# Patient Record
Sex: Male | Born: 1970 | Race: Black or African American | Hispanic: No | Marital: Single | Smoking: Current every day smoker
Health system: Southern US, Community
[De-identification: ages and names within clinical notes are randomized; demographics above are authoritative.]

## PROBLEM LIST (undated history)

## (undated) DIAGNOSIS — I10 Essential (primary) hypertension: Secondary | ICD-10-CM

---

## 2007-05-28 ENCOUNTER — Emergency Department (HOSPITAL_COMMUNITY): Admission: EM | Admit: 2007-05-28 | Discharge: 2007-05-28 | Payer: Self-pay | Admitting: Emergency Medicine

## 2014-01-09 ENCOUNTER — Emergency Department (HOSPITAL_COMMUNITY): Payer: Self-pay

## 2014-01-09 ENCOUNTER — Encounter (HOSPITAL_COMMUNITY): Payer: Self-pay | Admitting: Emergency Medicine

## 2014-01-09 ENCOUNTER — Emergency Department (HOSPITAL_COMMUNITY)
Admission: EM | Admit: 2014-01-09 | Discharge: 2014-01-10 | Disposition: A | Discharge status: Home / Self Care | Payer: Self-pay | Attending: Emergency Medicine | Admitting: Emergency Medicine

## 2014-01-09 DIAGNOSIS — S86912A Strain of unspecified muscle(s) and tendon(s) at lower leg level, left leg, initial encounter: Secondary | ICD-10-CM

## 2014-01-09 DIAGNOSIS — Y92009 Unspecified place in unspecified non-institutional (private) residence as the place of occurrence of the external cause: Secondary | ICD-10-CM | POA: Insufficient documentation

## 2014-01-09 DIAGNOSIS — Z791 Long term (current) use of non-steroidal anti-inflammatories (NSAID): Secondary | ICD-10-CM | POA: Insufficient documentation

## 2014-01-09 DIAGNOSIS — IMO0002 Reserved for concepts with insufficient information to code with codable children: Secondary | ICD-10-CM | POA: Insufficient documentation

## 2014-01-09 DIAGNOSIS — W19XXXA Unspecified fall, initial encounter: Secondary | ICD-10-CM

## 2014-01-09 DIAGNOSIS — Y9389 Activity, other specified: Secondary | ICD-10-CM | POA: Insufficient documentation

## 2014-01-09 DIAGNOSIS — W010XXA Fall on same level from slipping, tripping and stumbling without subsequent striking against object, initial encounter: Secondary | ICD-10-CM | POA: Insufficient documentation

## 2014-01-09 DIAGNOSIS — F17200 Nicotine dependence, unspecified, uncomplicated: Secondary | ICD-10-CM | POA: Insufficient documentation

## 2014-01-09 MED ORDER — IBUPROFEN 800 MG PO TABS: 800.0000 mg | ORAL_TABLET | Freq: Three times a day (TID) | ORAL | Status: AC

## 2014-01-09 MED ORDER — HYDROCODONE-ACETAMINOPHEN 5-325 MG PO TABS: 2.0000 | ORAL_TABLET | ORAL | Status: AC | PRN

## 2014-01-09 NOTE — Discharge Instructions (Signed)
Knee Pain Follow up with the wellness center. Return to the ED if you develop new or worsening symptoms. The knee is the complex joint between your thigh and your lower leg. It is made up of bones, tendons, ligaments, and cartilage. The bones that make up the knee are:  The femur in the thigh.  The tibia and fibula in the lower leg.  The patella or kneecap riding in the groove on the lower femur. CAUSES  Knee pain is a common complaint with many causes. A few of these causes are:  Injury, such as:  A ruptured ligament or tendon injury.  Torn cartilage.  Medical conditions, such as:  Gout  Arthritis  Infections  Overuse, over training, or overdoing a physical activity. Knee pain can be minor or severe. Knee pain can accompany debilitating injury. Minor knee problems often respond well to self-care measures or get well on their own. More serious injuries may need medical intervention or even surgery. SYMPTOMS The knee is complex. Symptoms of knee problems can vary widely. Some of the problems are:  Pain with movement and weight bearing.  Swelling and tenderness.  Buckling of the knee.  Inability to straighten or extend your knee.  Your knee locks and you cannot straighten it.  Warmth and redness with pain and fever.  Deformity or dislocation of the kneecap. DIAGNOSIS  Determining what is wrong may be very straight forward such as when there is an injury. It can also be challenging because of the complexity of the knee. Tests to make a diagnosis may include:  Your caregiver taking a history and doing a physical exam.  Routine X-rays can be used to rule out other problems. X-rays will not reveal a cartilage tear. Some injuries of the knee can be diagnosed by:  Arthroscopy a surgical technique by which a small video camera is inserted through tiny incisions on the sides of the knee. This procedure is used to examine and repair internal knee joint problems. Tiny  instruments can be used during arthroscopy to repair the torn knee cartilage (meniscus).  Arthrography is a radiology technique. A contrast liquid is directly injected into the knee joint. Internal structures of the knee joint then become visible on X-ray film.  An MRI scan is a non X-ray radiology procedure in which magnetic fields and a computer produce two- or three-dimensional images of the inside of the knee. Cartilage tears are often visible using an MRI scanner. MRI scans have largely replaced arthrography in diagnosing cartilage tears of the knee.  Blood work.  Examination of the fluid that helps to lubricate the knee joint (synovial fluid). This is done by taking a sample out using a needle and a syringe. TREATMENT The treatment of knee problems depends on the cause. Some of these treatments are:  Depending on the injury, proper casting, splinting, surgery, or physical therapy care will be needed.  Give yourself adequate recovery time. Do not overuse your joints. If you begin to get sore during workout routines, back off. Slow down or do fewer repetitions.  For repetitive activities such as cycling or running, maintain your strength and nutrition.  Alternate muscle groups. For example, if you are a weight lifter, work the upper body on one day and the lower body the next.  Either tight or weak muscles do not give the proper support for your knee. Tight or weak muscles do not absorb the stress placed on the knee joint. Keep the muscles surrounding the knee strong.  Take care of mechanical problems.  If you have flat feet, orthotics or special shoes may help. See your caregiver if you need help.  Arch supports, sometimes with wedges on the inner or outer aspect of the heel, can help. These can shift pressure away from the side of the knee most bothered by osteoarthritis.  A brace called an "unloader" brace also may be used to help ease the pressure on the most arthritic side of the  knee.  If your caregiver has prescribed crutches, braces, wraps or ice, use as directed. The acronym for this is PRICE. This means protection, rest, ice, compression, and elevation.  Nonsteroidal anti-inflammatory drugs (NSAIDs), can help relieve pain. But if taken immediately after an injury, they may actually increase swelling. Take NSAIDs with food in your stomach. Stop them if you develop stomach problems. Do not take these if you have a history of ulcers, stomach pain, or bleeding from the bowel. Do not take without your caregiver's approval if you have problems with fluid retention, heart failure, or kidney problems.  For ongoing knee problems, physical therapy may be helpful.  Glucosamine and chondroitin are over-the-counter dietary supplements. Both may help relieve the pain of osteoarthritis in the knee. These medicines are different from the usual anti-inflammatory drugs. Glucosamine may decrease the rate of cartilage destruction.  Injections of a corticosteroid drug into your knee joint may help reduce the symptoms of an arthritis flare-up. They may provide pain relief that lasts a few months. You may have to wait a few months between injections. The injections do have a small increased risk of infection, water retention, and elevated blood sugar levels.  Hyaluronic acid injected into damaged joints may ease pain and provide lubrication. These injections may work by reducing inflammation. A series of shots may give relief for as long as 6 months.  Topical painkillers. Applying certain ointments to your skin may help relieve the pain and stiffness of osteoarthritis. Ask your pharmacist for suggestions. Many over the-counter products are approved for temporary relief of arthritis pain.  In some countries, doctors often prescribe topical NSAIDs for relief of chronic conditions such as arthritis and tendinitis. A review of treatment with NSAID creams found that they worked as well as oral  medications but without the serious side effects. PREVENTION  Maintain a healthy weight. Extra pounds put more strain on your joints.  Get strong, stay limber. Weak muscles are a common cause of knee injuries. Stretching is important. Include flexibility exercises in your workouts.  Be smart about exercise. If you have osteoarthritis, chronic knee pain or recurring injuries, you may need to change the way you exercise. This does not mean you have to stop being active. If your knees ache after jogging or playing basketball, consider switching to swimming, water aerobics, or other low-impact activities, at least for a few days a week. Sometimes limiting high-impact activities will provide relief.  Make sure your shoes fit well. Choose footwear that is right for your sport.  Protect your knees. Use the proper gear for knee-sensitive activities. Use kneepads when playing volleyball or laying carpet. Buckle your seat belt every time you drive. Most shattered kneecaps occur in car accidents.  Rest when you are tired. SEEK MEDICAL CARE IF:  You have knee pain that is continual and does not seem to be getting better.  SEEK IMMEDIATE MEDICAL CARE IF:  Your knee joint feels hot to the touch and you have a high fever. MAKE SURE YOU:   Understand  these instructions.  Will watch your condition.  Will get help right away if you are not doing well or get worse. Document Released: 05/06/2007 Document Revised: 10/01/2011 Document Reviewed: 05/06/2007 [facility] Patient Information 2015 [place], [place]. This information is not intended to replace advice given to you by your health care provider. Make sure you discuss any questions you have with your health care provider.

## 2014-01-09 NOTE — ED Notes (Signed)
Patient transported to X-ray 

## 2014-01-09 NOTE — ED Provider Notes (Signed)
CSN: [id number]     Arrival date & time 01/09/14  2109 History   First MD Initiated Contact with Patient 01/09/14 2124     Chief Complaint  Patient presents with  . Fall  . Knee Pain     (Consider location/radiation/quality/duration/timing/severity/associated sxs/prior Treatment) HPI Comments: Patient complains of pain in his low back and left knee after slipping on water this afternoon. This happened 1 hour ago. Denies hitting head or losing consciousness. Denies any focal weakness, numbness or tingling. No bowel or bladder incontinence. No fever or vomiting. Patient denies any previous back problems. The pain in his left knee is worse with weightbearing and worse with movement. He is not taking any medications at home. Denies any head, neck, abdomen or chest pain.  The history is provided by the patient.    History reviewed. No pertinent past medical history. History reviewed. No pertinent past surgical history. No family history on file. History  Substance Use Topics  . Smoking status: Current Every Day Smoker -- 1.00 packs/day    Types: Cigarettes  . Smokeless tobacco: Not on file  . Alcohol Use: 3.0 oz/week    5 Cans of beer per week    Review of Systems  Constitutional: Negative for fever, activity change and appetite change.  HENT: Negative for congestion and rhinorrhea.   Respiratory: Negative for cough, chest tightness and shortness of breath.   Cardiovascular: Negative for chest pain.  Gastrointestinal: Negative for nausea, vomiting and abdominal pain.  Genitourinary: Negative for dysuria and hematuria.  Musculoskeletal: Positive for arthralgias, back pain and myalgias. Negative for neck pain and neck stiffness.  Skin: Negative for rash.  Neurological: Negative for dizziness, weakness and headaches.  A complete 10 system review of systems was obtained and all systems are negative except as noted in the HPI and PMH.      Allergies  Review of patient's allergies  indicates no known allergies.  Home Medications   Prior to Admission medications   Medication Sig Start Date End Date Taking? Authorizing Provider  HYDROcodone-acetaminophen (NORCO/VICODIN) 5-325 MG per tablet Take 2 tablets by mouth every 4 (four) hours as needed. 01/09/14   Glynn OctaveStephen Rancour, MD  ibuprofen (ADVIL,MOTRIN) 800 MG tablet Take 1 tablet (800 mg total) by mouth 3 (three) times daily. 01/09/14   Glynn OctaveStephen Rancour, MD   BP 145/87  Pulse 65  Temp(Src) 98.1 F (36.7 C) (Oral)  Resp 18  SpO2 98% Physical Exam  Nursing note and vitals reviewed. Constitutional: He is oriented to person, place, and time. He appears well-developed and well-nourished. No distress.  HENT:  Head: Normocephalic and atraumatic.  Mouth/Throat: Oropharynx is clear and moist. No oropharyngeal exudate.  Eyes: Conjunctivae and EOM are normal. Pupils are equal, round, and reactive to light.  Neck: Normal range of motion. Neck supple.  No meningismus. No C-spine tenderness  Cardiovascular: Normal rate, regular rhythm, normal heart sounds and intact distal pulses.   No murmur heard. Pulmonary/Chest: Effort normal and breath sounds normal. No respiratory distress.  Abdominal: Soft. There is no tenderness. There is no rebound and no guarding.  Musculoskeletal: Normal range of motion. He exhibits tenderness. He exhibits no edema.  Lumbar spinal tenderness without step-off. 5/5 strength in bilateral lower extremities. Ankle plantar and dorsiflexion intact. Great toe extension intact bilaterally. +2 DP and PT pulses. +2 patellar reflexes bilaterally. Normal gait.  Left knee with lateral joint line tenderness. Flexion and extension intact. Intact distal pulses. Achilles intact  Neurological: He is alert and oriented  to person, place, and time. No cranial nerve deficit. He exhibits normal muscle tone. Coordination normal.  Skin: Skin is warm. No erythema.  Psychiatric: He has a normal mood and affect. His behavior is  normal.    ED Course  Procedures (including critical care time) Labs Review Labs Reviewed - No data to display  Imaging Review Dg Lumbar Spine Complete  01/09/2014   CLINICAL DATA:  Status post fall.  Lower back pain.  EXAM: LUMBAR SPINE - COMPLETE 4+ VIEW  COMPARISON:  None.  FINDINGS: There is no evidence of fracture or subluxation. Vertebral bodies demonstrate normal height and alignment. Intervertebral disc spaces are preserved. The visualized neural foramina are grossly unremarkable in appearance.  The visualized bowel gas pattern is unremarkable in appearance; air and stool are noted within the colon. The sacroiliac joints are within normal limits.  IMPRESSION: No evidence of fracture or subluxation along the lumbar spine.   Electronically Signed   By: Roanna RaiderJeffery  Chang M.D.   On: 01/09/2014 22:11   Dg Knee Complete 4 Views Left  01/09/2014   CLINICAL DATA:  Trauma, knee pain  EXAM: LEFT KNEE - COMPLETE 4+ VIEW  COMPARISON:  None.  FINDINGS: No fracture of the proximal tibia or distal femur. Patella is normal. No joint effusion.  IMPRESSION: No acute osseous abnormality.   Electronically Signed   By: Genevive BiStewart  Edmunds M.D.   On: 01/09/2014 22:11     EKG Interpretation None      MDM   Final diagnoses:  Knee strain, left, initial encounter  Fall, initial encounter   Back and knee pain after falling on a wet surface. No loss of consciousness. No head or neck pain.  X-rays are negative for bony pathology. Patient denies hitting head or neck. Neurologically intact No evidence of cauda equina or cord compression.  He is able to ambulate. We'll place a knee sleeve, anti-inflammatories, Rice therapy, followup with PCP.  Glynn OctaveStephen Rancour, MD 01/09/14 2352

## 2014-01-09 NOTE — ED Notes (Signed)
Pt was able to ambulate with no problem 

## 2014-01-09 NOTE — ED Notes (Signed)
Pt reports slipping on water this afternoon, landing on back. Now reports lower back pain and left knee pain. Denies hitting head or LOC. Ambulatory to triage.

## 2014-01-09 NOTE — ED Notes (Signed)
Pt reports he slipped at home in the driveway while unloading groceries and bent left leg under him. Pt also reports low back pain since fall.  Denies any other injuries.

## 2014-01-09 NOTE — ED Notes (Signed)
Returned from radiology.  No change in condition.

## 2014-01-10 NOTE — ED Notes (Signed)
Refused wheelchair 

## 2014-08-14 IMAGING — CR DG KNEE COMPLETE 4+V*L*
4 series · 4 of 4 positions shown · non-contrast
Comparison: None.

CLINICAL DATA: Trauma, knee pain

EXAM:
LEFT KNEE - COMPLETE 4+ VIEW

[t knee ap left]
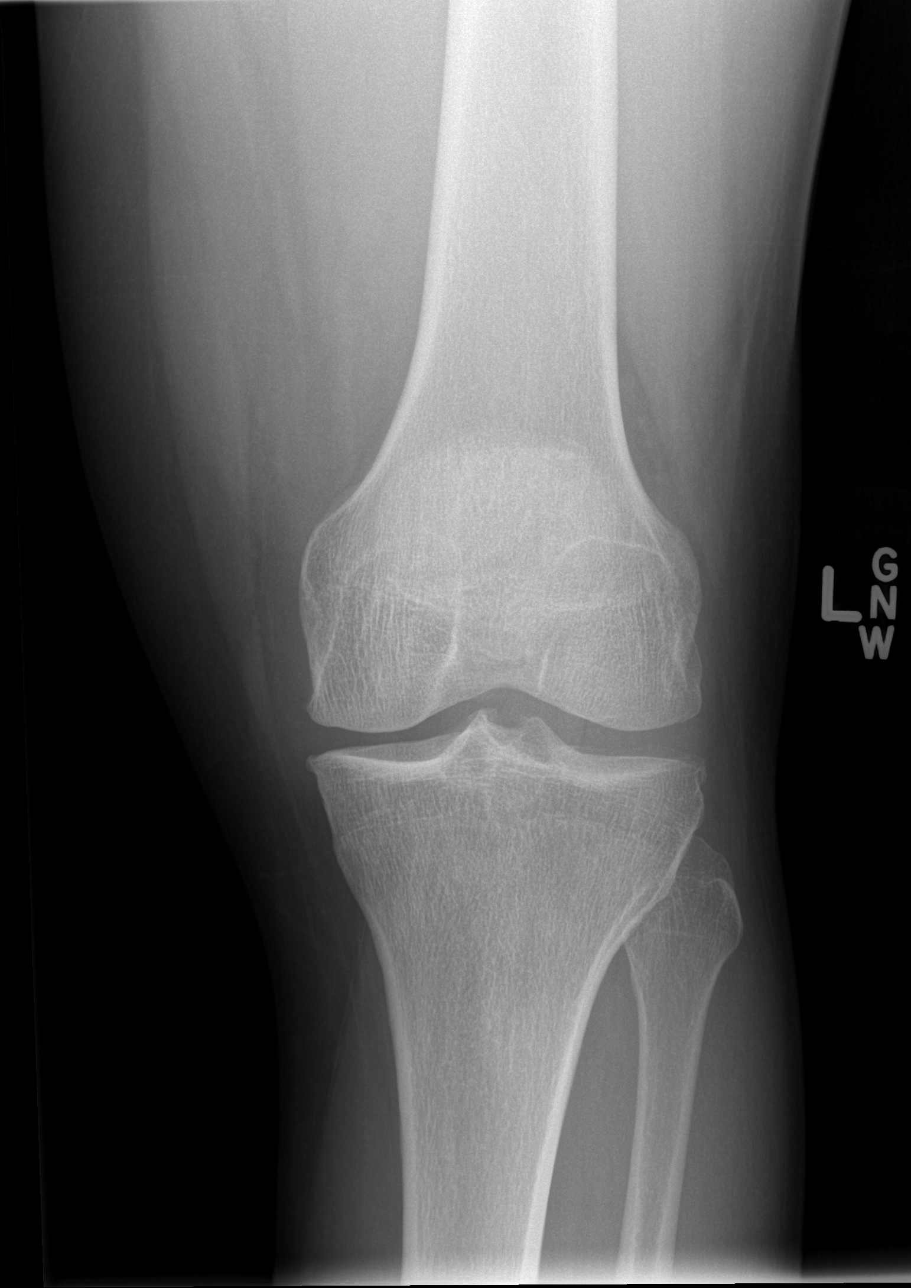

[t knee oblique left (1 of 2)]
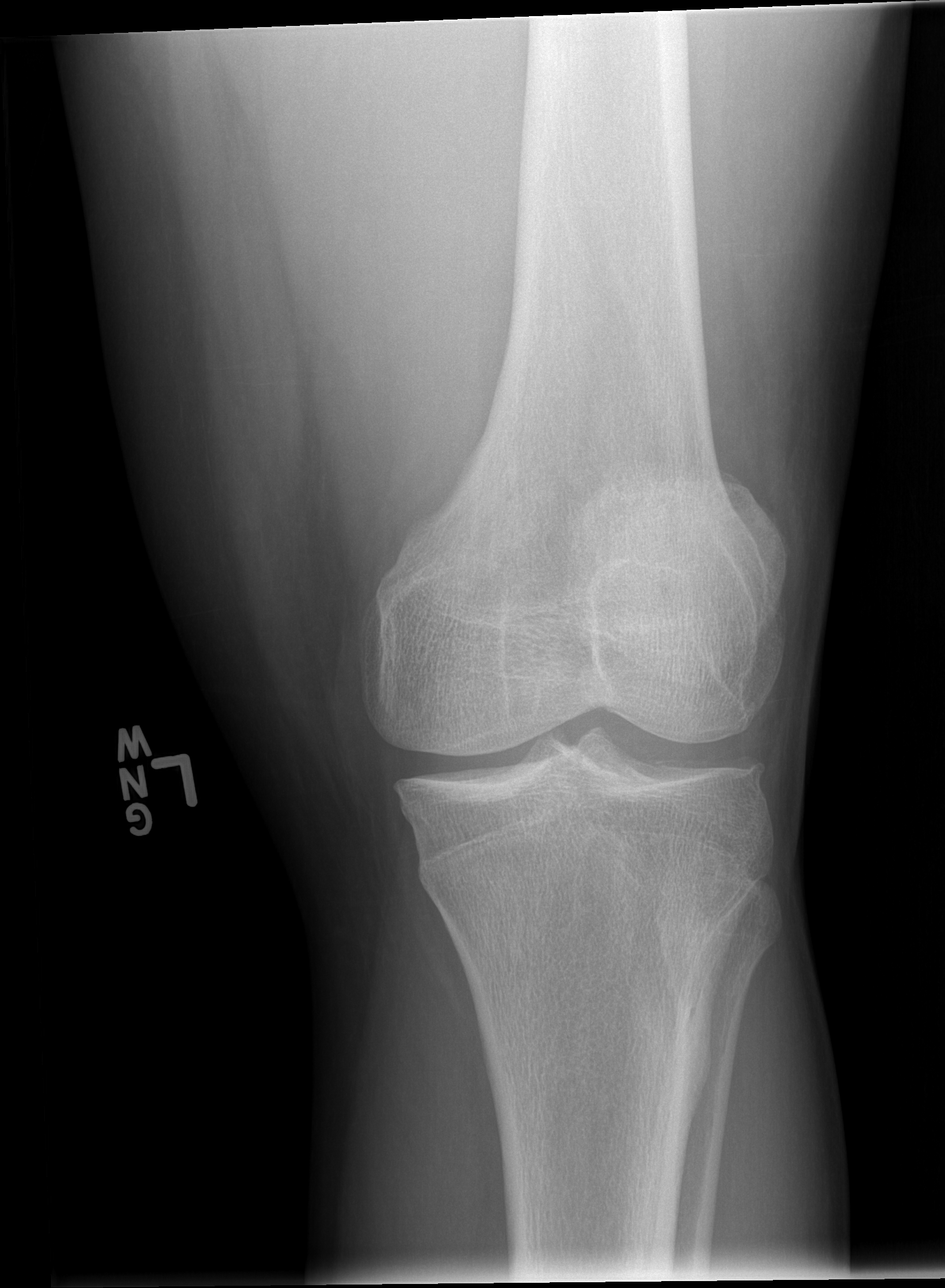

[t knee oblique left (2 of 2)]
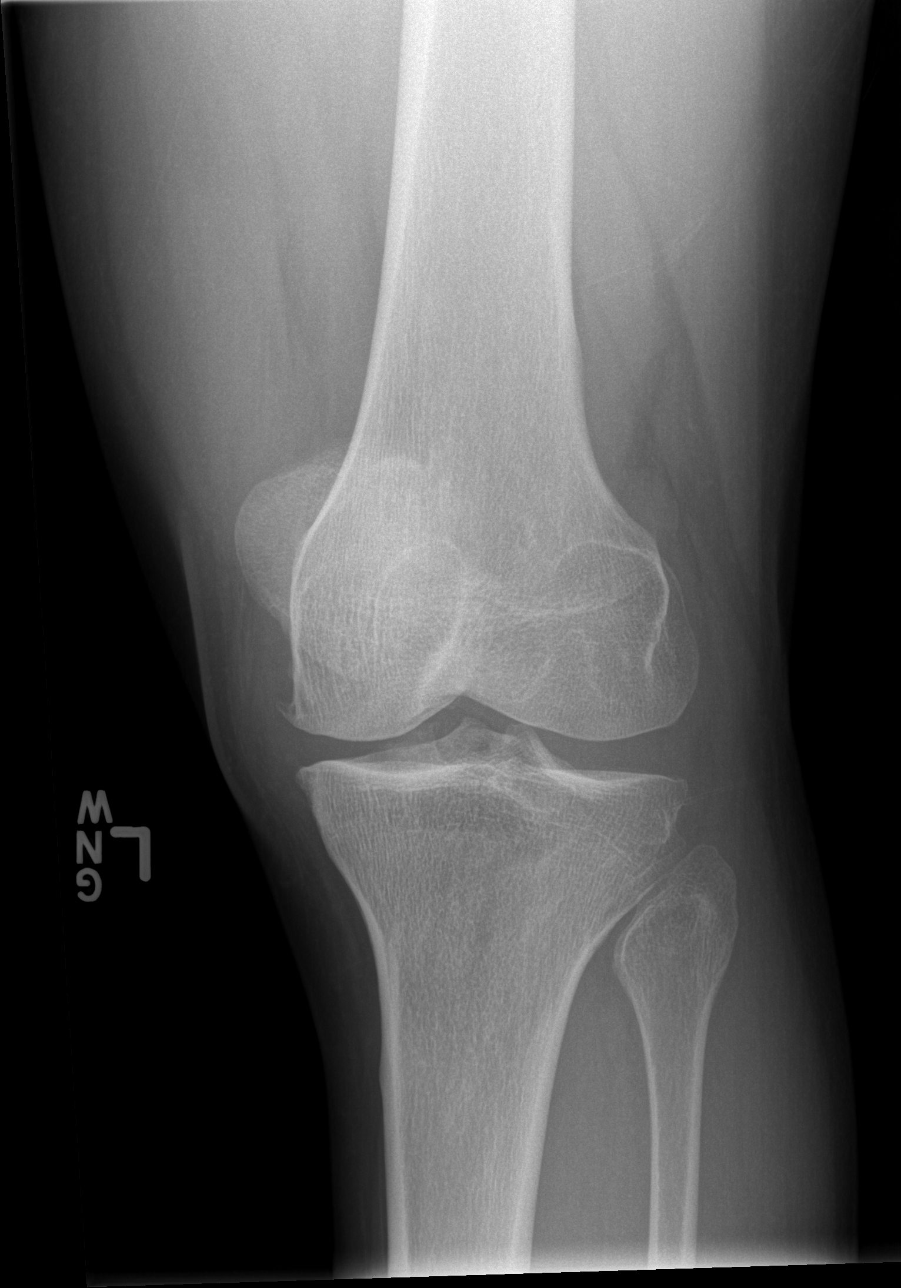

[t knee lat left]
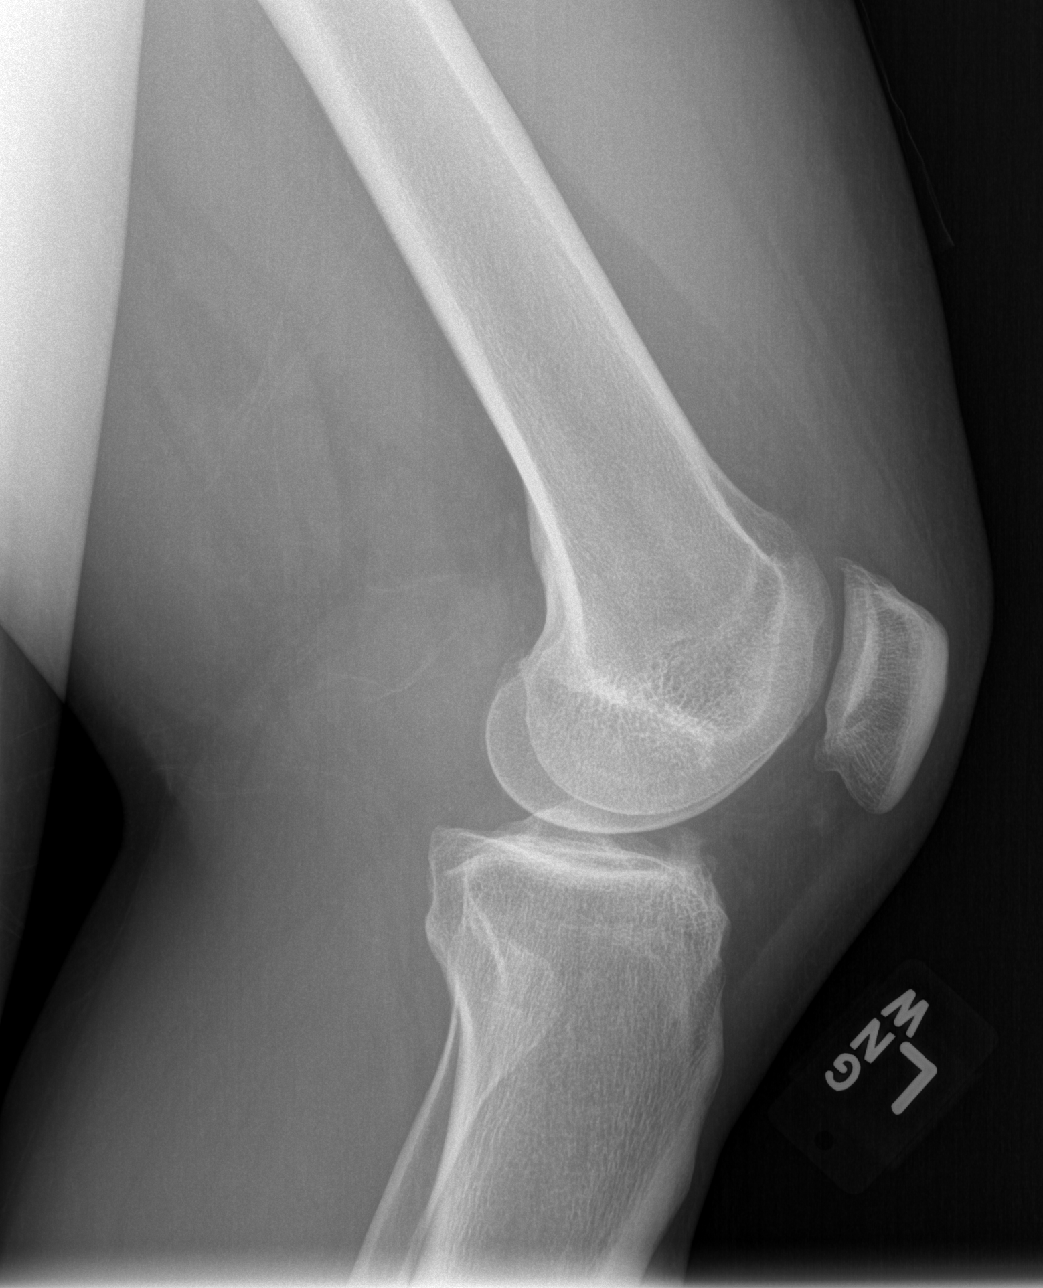

[4 of 4 positions shown; findings below may reference images not displayed]

FINDINGS: No fracture of the proximal tibia or distal femur. Patella is
normal. No joint effusion.
IMPRESSION: No acute osseous abnormality.

## 2022-05-14 ENCOUNTER — Ambulatory Visit (INDEPENDENT_AMBULATORY_CARE_PROVIDER_SITE_OTHER): Payer: Self-pay | Admitting: Nurse Practitioner

## 2022-05-14 ENCOUNTER — Encounter: Payer: Self-pay | Admitting: Nurse Practitioner

## 2022-05-14 VITALS — BP 155/89 | HR 75 | Temp 98.8°F | Ht 68.0 in | Wt 235.0 lb

## 2022-05-14 DIAGNOSIS — Z716 Tobacco abuse counseling: Secondary | ICD-10-CM

## 2022-05-14 DIAGNOSIS — Z789 Other specified health status: Secondary | ICD-10-CM

## 2022-05-14 DIAGNOSIS — Z131 Encounter for screening for diabetes mellitus: Secondary | ICD-10-CM

## 2022-05-14 DIAGNOSIS — I10 Essential (primary) hypertension: Secondary | ICD-10-CM

## 2022-05-14 MED ORDER — AMLODIPINE BESYLATE 2.5 MG PO TABS: 2.5000 mg | ORAL_TABLET | Freq: Every day | ORAL | 0 refills | Status: AC

## 2022-05-14 NOTE — Assessment & Plan Note (Signed)
Smokes about 1 pack/day  Asked about quitting: confirms that he/she currently smokes cigarettes Advise to quit smoking: Educated about QUITTING to reduce the risk of cancer, cardio and cerebrovascular disease. Assess willingness: Unwilling to quit at this time, but is working on cutting back. Assist with counseling and pharmacotherapy: Counseled for 5 minutes and literature provided. Arrange for follow up: follow up in 1 month and continue to offer help.

## 2022-05-14 NOTE — Assessment & Plan Note (Signed)
Like to quit drinking alcohol including risk of follow-up intoxication, liver damage discussed with patient he verbalized understanding

## 2022-05-14 NOTE — Assessment & Plan Note (Signed)
A1c 5.8 Avoid sugar sweets soda engage in regular moderate exercise at least 150 minutes weekly

## 2022-05-14 NOTE — Progress Notes (Signed)
New Patient Office Visit  Subjective:  Patient ID: Duane Coleman, male    DOB: [birth year]  Age: 51 y.o. MRN: [id number]  CC:  Chief Complaint  Patient presents with   Establish Care    HPI Duane Coleman is a 51 y.o. male with past no significant  medical history who presents for establishing care and follow-up on hypertension .  He has no previous PCP. patient stated 2 weeks ago he went for a work physical , his blood pressure was found to be elevated .  He has been monitoring his blood pressure reports blood pressure readings of 150s over 80s .blood pressure reading was initially 160/91 when  checked during his work physical .  Patient denies chest pain, dizziness, edema, syncope states that he has never been on medications for hypertension.   Drinks 3 cans of beer daily need to quit drinking including risk of falls, liver damage, discussed patient verbalized understanding.  Current everyday smoker smokes 1 pack of cigarettes daily,Smokes marijuana twice weekly   he started smoking at age 89 patient denies shortness of breath, wheezing, cough.  Need to quit smoking discussed.   Due for shingles vaccine, Tdap vaccine, influenza vaccine need for all vaccines discussed with patient patient encouraged to get the vaccines at his pharmacy he verbalized understanding.  He is also due for colonoscopy      Started smoking at age 39, apack a day    History reviewed. No pertinent past medical history.  History reviewed. No pertinent surgical history.  Family History  Problem Relation Age of Onset   Brain cancer Mother    Sickle cell anemia Brother        died at 39   CVA Brother    Prostate cancer Neg Hx    Colon cancer Neg Hx     Social History   Socioeconomic History   Marital status: Single    Spouse name: Not on file   Number of children: 3   Years of education: Not on file   Highest education level: Not on file  Occupational History   Not on file  Tobacco Use    Smoking status: Every Day    Packs/day: 1.00    Types: Cigarettes   Smokeless tobacco: Not on file  Substance and Sexual Activity   Alcohol use: Yes    Alcohol/week: 3.0 standard drinks of alcohol    Types: 3 Cans of beer per week    Comment: 3 beer daily.   Drug use: Yes    Types: Marijuana   Sexual activity: Yes  Other Topics Concern   Not on file  Social History Narrative   Lives with his fiance   Social Determinants of Health   Financial Resource Strain: Not on file  Food Insecurity: Not on file  Transportation Needs: Not on file  Physical Activity: Not on file  Stress: Not on file  Social Connections: Not on file  Intimate Partner Violence: Not on file    ROS Review of Systems  Constitutional: Negative.  Negative for activity change, appetite change and chills.  Respiratory: Negative.  Negative for apnea, choking, chest tightness, shortness of breath, wheezing and stridor.   Cardiovascular: Negative.  Negative for chest pain, palpitations and leg swelling.  Gastrointestinal: Negative.  Negative for abdominal distention, abdominal pain and anal bleeding.  Neurological: Negative.  Negative for dizziness, facial asymmetry, light-headedness and headaches.  Psychiatric/Behavioral: Negative.  Negative for agitation, behavioral problems, confusion and decreased concentration.  Objective:   Today's Vitals: BP (!) 155/89   Pulse 75   Temp 98.8 F (37.1 C)   Ht 5' 8"  (1.727 m)   Wt 235 lb (106.6 kg)   SpO2 97%   BMI 35.73 kg/m   Physical Exam Constitutional:      General: He is not in acute distress.    Appearance: He is not ill-appearing, toxic-appearing or diaphoretic.  Eyes:     General: No scleral icterus.       Right eye: No discharge.        Left eye: No discharge.     Extraocular Movements: Extraocular movements intact.     Conjunctiva/sclera: Conjunctivae normal.     Pupils: Pupils are equal, round, and reactive to light.  Cardiovascular:     Rate  and Rhythm: Normal rate and regular rhythm.     Pulses: Normal pulses.     Heart sounds: Normal heart sounds. No murmur heard.    No friction rub. No gallop.  Pulmonary:     Effort: Pulmonary effort is normal. No respiratory distress.     Breath sounds: Normal breath sounds. No stridor. No wheezing, rhonchi or rales.  Chest:     Chest wall: No tenderness.  Abdominal:     General: There is no distension.     Palpations: Abdomen is soft.     Tenderness: There is no abdominal tenderness. There is no guarding.  Musculoskeletal:     Right lower leg: No edema.     Left lower leg: No edema.  Skin:    Capillary Refill: Capillary refill takes less than 2 seconds.     Coloration: Skin is not jaundiced or pale.     Findings: No bruising, erythema, lesion or rash.  Neurological:     Mental Status: He is alert and oriented to person, place, and time.     Cranial Nerves: No cranial nerve deficit.     Sensory: No sensory deficit.     Motor: No weakness.     Gait: Gait normal.  Psychiatric:        Mood and Affect: Mood normal.        Behavior: Behavior normal.        Thought Content: Thought content normal.        Judgment: Judgment normal.     Assessment & Plan:   Problem List Items Addressed This Visit       Cardiovascular and Mediastinum   High blood pressure    BP Readings from Last 3 Encounters:  05/14/22 (!) 155/89  01/09/14 145/87  EKG shows  NSR , Non specific  T wave abnormality. I have no concern for ischemia  DASH diet advised patient encouraged to engage in regular moderate to vigorous exercises at least 150 minutes weekly Follow-up in 4 weeks Need to quit smoking and drinking alcohol discussed with patient      Relevant Medications   amLODipine (NORVASC) 2.5 MG tablet   Other Relevant Orders   CBC with Differential   CMP14+EGFR   EKG 12-Lead     Other   Tobacco abuse counseling    Smokes about 1 pack/day  Asked about quitting: confirms that he/she currently  smokes cigarettes Advise to quit smoking: Educated about QUITTING to reduce the risk of cancer, cardio and cerebrovascular disease. Assess willingness: Unwilling to quit at this time, but is working on cutting back. Assist with counseling and pharmacotherapy: Counseled for 5 minutes and literature provided. Arrange for follow up: follow  up in 1 month and continue to offer help.       Alcohol use    Like to quit drinking alcohol including risk of follow-up intoxication, liver damage discussed with patient he verbalized understanding      Screening for diabetes mellitus - Primary    A1c 5.8 Avoid sugar sweets soda engage in regular moderate exercise at least 150 minutes weekly       Outpatient Encounter Medications as of 05/14/2022  Medication Sig   amLODipine (NORVASC) 2.5 MG tablet Take 1 tablet (2.5 mg total) by mouth daily.   HYDROcodone-acetaminophen (NORCO/VICODIN) 5-325 MG per tablet Take 2 tablets by mouth every 4 (four) hours as needed. (Patient not taking: Reported on 05/14/2022)   ibuprofen (ADVIL,MOTRIN) 800 MG tablet Take 1 tablet (800 mg total) by mouth 3 (three) times daily. (Patient not taking: Reported on 05/14/2022)   No facility-administered encounter medications on file as of 05/14/2022.    Follow-up: Return in about 4 weeks (around 06/11/2022) for HTN.   Renee Rival, FNP

## 2022-05-14 NOTE — Patient Instructions (Addendum)
1. Hypertension, unspecified type  - CBC with Differential - CMP14+EGFR - EKG 12-Lead - amLODipine (NORVASC) 2.5 MG tablet; Take 1 tablet (2.5 mg total) by mouth daily.  Dispense: 60 tablet; Refill: 0  2. Tobacco abuse counseling   3. Alcohol use      Please get your shingles vaccine, vaccine, and influenza vaccines at the pharmacy as discussed     Around 3 times per week, check your blood pressure 2 times per day. once in the morning and once in the evening. The readings should be at least one minute apart. Write down these values and bring them to your next nurse visit/appointment.  When you check your BP, make sure you have been doing something calm/relaxing 5 minutes prior to checking. Both feet should be flat on the floor and you should be sitting. Use your left arm and make sure it is in a relaxed position (on a table), and that the cuff is at the approximate level/height of your heart.       Thanks for choosing [facility] we consider it a privelige to serve you.  

## 2022-05-14 NOTE — Assessment & Plan Note (Addendum)
BP Readings from Last 3 Encounters:  05/14/22 (!) 155/89  01/09/14 145/87  EKG shows  NSR , Non specific  T wave abnormality. I have no concern for ischemia  DASH diet advised patient encouraged to engage in regular moderate to vigorous exercises at least 150 minutes weekly Follow-up in 4 weeks Need to quit smoking and drinking alcohol discussed with patient

## 2022-05-15 LAB — POCT GLYCOSYLATED HEMOGLOBIN (HGB A1C): Hemoglobin A1C: 5.8 % — AB (ref 4.0–5.6)

## 2022-05-15 LAB — POCT URINALYSIS DIP (PROADVANTAGE DEVICE)
Bilirubin, UA: NEGATIVE
Blood, UA: NEGATIVE
Glucose, UA: NEGATIVE mg/dL
Ketones, POC UA: NEGATIVE mg/dL
Leukocytes, UA: NEGATIVE
Nitrite, UA: NEGATIVE
Protein Ur, POC: NEGATIVE mg/dL
Specific Gravity, Urine: 1.025
Urobilinogen, Ur: 0.2
pH, UA: 5.5 (ref 5.0–8.0)

## 2022-05-15 NOTE — Addendum Note (Signed)
Addended by: Elyse Jarvis on: 05/15/2022 08:12 AM   Modules accepted: Orders

## 2022-05-16 ENCOUNTER — Telehealth: Payer: Self-pay | Admitting: General Practice

## 2022-05-16 LAB — CMP14+EGFR
ALT: 19 IU/L (ref 0–44)
AST: 16 IU/L (ref 0–40)
Albumin/Globulin Ratio: 1.3 (ref 1.2–2.2)
Albumin: 4.1 g/dL (ref 4.1–5.1)
Alkaline Phosphatase: 70 IU/L (ref 44–121)
BUN/Creatinine Ratio: 11 (ref 9–20)
BUN: 12 mg/dL (ref 6–24)
Bilirubin Total: <0.2 mg/dL (ref 0.0–1.2)
CO2: 22 mmol/L (ref 20–29)
Calcium: 9.4 mg/dL (ref 8.7–10.2)
Chloride: 102 mmol/L (ref 96–106)
Creatinine, Ser: 1.06 mg/dL (ref 0.76–1.27)
Globulin, Total: 3.1 g/dL (ref 1.5–4.5)
Glucose: 67 mg/dL — ABNORMAL LOW (ref 70–99)
Potassium: 7.9 mmol/L (ref 3.5–5.2)
Sodium: 135 mmol/L (ref 134–144)
Total Protein: 7.2 g/dL (ref 6.0–8.5)
eGFR: 85 mL/min/{1.73_m2} (ref >59)

## 2022-05-16 LAB — CBC WITH DIFFERENTIAL/PLATELET
Basophils Absolute: 0.1 10*3/uL (ref 0.0–0.2)
Basos: 1 %
EOS (ABSOLUTE): 0 10*3/uL (ref 0.0–0.4)
Eos: 0 %
Hematocrit: 45.5 % (ref 37.5–51.0)
Hemoglobin: 14.8 g/dL (ref 13.0–17.7)
Immature Grans (Abs): 0 10*3/uL (ref 0.0–0.1)
Immature Granulocytes: 0 %
Lymphocytes Absolute: 2.2 10*3/uL (ref 0.7–3.1)
Lymphs: 37 %
MCH: 28.6 pg (ref 26.6–33.0)
MCHC: 32.5 g/dL (ref 31.5–35.7)
MCV: 88 fL (ref 79–97)
Monocytes Absolute: 0.6 10*3/uL (ref 0.1–0.9)
Monocytes: 9 %
Neutrophils Absolute: 3.1 10*3/uL (ref 1.4–7.0)
Neutrophils: 53 %
Platelets: 250 10*3/uL (ref 150–450)
RBC: 5.18 x10E6/uL (ref 4.14–5.80)
RDW: 13.4 % (ref 11.6–15.4)
WBC: 5.9 10*3/uL (ref 3.4–10.8)

## 2022-05-16 NOTE — Telephone Encounter (Signed)
CRITICAL LAB RESULTS: pottassium 7.9

## 2022-05-16 NOTE — Progress Notes (Signed)
Unable to reach the phone on the phone after several attempts , I left a voice mail for  the patient to go to the ER at [facility] for labs recheck due to his critical potassium level. I also notified the charge at [facility] ER,  RN  Arby.

## 2022-05-17 NOTE — Progress Notes (Signed)
Mailed pt letter per provider request. [facility]

## 2022-05-18 NOTE — Telephone Encounter (Signed)
Mailed pt a letter. To advised that he needs to go to the ER. No answer after several attempts. [place]

## 2022-06-11 ENCOUNTER — Ambulatory Visit: Payer: Self-pay | Admitting: Nurse Practitioner

## 2023-04-12 ENCOUNTER — Telehealth: Payer: Self-pay

## 2023-04-12 NOTE — Patient Outreach (Signed)
Care Guide Note  04/12/2023 Name: Duane Coleman MRN: [id number] DOB: 08-Nov-1970  Referred by: Donell Beers, FNP Reason for referral : paient outreach  (Outreach to schedule with RPH. )   Duane Coleman is a 52 y.o. year old male who is a primary care patient of Donell Beers, FNP. Duane Coleman was referred to the pharmacist for assistance related to HTN.    An unsuccessful telephone outreach was attempted today to contact the patient who was referred to the pharmacy team for assistance with HTN. Additional attempts will be made to contact the patient.   Duane Coleman [phone]

## 2023-05-15 ENCOUNTER — Other Ambulatory Visit: Payer: Self-pay

## 2023-05-15 ENCOUNTER — Encounter (HOSPITAL_COMMUNITY): Payer: Self-pay

## 2023-05-15 ENCOUNTER — Emergency Department (HOSPITAL_COMMUNITY)
Admission: EM | Admit: 2023-05-15 | Discharge: 2023-05-15 | Disposition: A | Discharge status: Home / Self Care | Payer: Medicaid Other | Attending: Emergency Medicine | Admitting: Emergency Medicine

## 2023-05-15 DIAGNOSIS — H6123 Impacted cerumen, bilateral: Secondary | ICD-10-CM | POA: Insufficient documentation

## 2023-05-15 DIAGNOSIS — H9203 Otalgia, bilateral: Secondary | ICD-10-CM | POA: Diagnosis present

## 2023-05-15 MED ORDER — CARBAMIDE PEROXIDE 6.5 % OT SOLN: 5.0000 [drp] | Freq: Two times a day (BID) | OTIC | 0 refills | Status: AC

## 2023-05-15 NOTE — ED Provider Notes (Signed)
[facility]-EMERGENCY DEPT [facility] Emergency Department Provider Note MRN:  [id number]  Arrival date & time: 05/15/23     Chief Complaint   Ear Pain   History of Present Illness   Duane Coleman is a 52 y.o. year-old male presents to the ED with chief complaint of ear pain.  States both ears hurt, but left worse than right.  Reports having the pain for the past 3-4 days.  No treatments PTA.  Denies fever or chills.  Denies using q-tips.  History provided by patient.   Review of Systems  Pertinent positive and negative review of systems noted in HPI.    Physical Exam   Vitals:   05/15/23 0117  BP: (!) 136/100  Pulse: 75  Resp: 16  Temp: (!) 97.5 F (36.4 C)  SpO2: 99%    CONSTITUTIONAL:  well-appearing, NAD NEURO:  Alert and oriented x 3, CN 3-12 grossly intact EYES:  eyes equal and reactive ENT/NECK:  Supple, no stridor, cerumen impaction bilaterally CARDIO:  appears well-perfused  PULM:  No respiratory distress,  GI/GU:  non-distended,  MSK/SPINE:  No gross deformities, no edema, moves all extremities  SKIN:  no rash, atraumatic   *Additional and/or pertinent findings included in MDM below  Diagnostic and Interventional Summary    EKG Interpretation Date/Time:    Ventricular Rate:    PR Interval:    QRS Duration:    QT Interval:    QTC Calculation:   R Axis:      Text Interpretation:         Labs Reviewed - No data to display  No orders to display    Medications - No data to display   Procedures  /  Critical Care .Ear Cerumen Removal  Date/Time: 05/15/2023 5:15 AM  Performed by: Roxy Horseman, PA-C Authorized by: Roxy Horseman, PA-C   Consent:    Consent obtained:  Verbal   Consent given by:  Patient   Risks, benefits, and alternatives were discussed: yes     Risks discussed:  Bleeding, incomplete removal, TM perforation and pain   Alternatives discussed:  No treatment Universal protocol:    Procedure explained and questions  answered to patient or proxy's satisfaction: yes     Relevant documents present and verified: yes     Test results available: yes     Imaging studies available: yes     Required blood products, implants, devices, and special equipment available: yes     Site/side marked: yes     Immediately prior to procedure, a time out was called: yes     Patient identity confirmed:  Verbally with patient Procedure details:    Location:  L ear and R ear   Procedure type: curette     Procedure outcomes: cerumen removed   Post-procedure details:    Inspection:  Some cerumen remaining   Hearing quality:  Improved   Procedure completion:  Tolerated well, no immediate complications   ED Course and Medical Decision Making  I have reviewed the triage vital signs, the nursing notes, and pertinent available records from the EMR.  Social Determinants Affecting Complexity of Care: Patient has no clinically significant social determinants affecting this chief complaint..   ED Course:    Medical Decision Making Patient here with bilateral ear pain.  On exam has bilateral cerumen impaction without any sign of infection.  He denies any URI symptoms.    I was able to remove a large amount of cerumen with the curette, but some  remains.  I've rx'd debrox and given him a bulb syringe to irrigate his ears.  Risk OTC drugs.         Consultants: No consultations were needed in caring for this patient.   Treatment and Plan: Emergency department workup does not suggest an emergent condition requiring admission or immediate intervention beyond  what has been performed at this time. The patient is safe for discharge and has  been instructed to return immediately for worsening symptoms, change in  symptoms or any other concerns    Final Clinical Impressions(s) / ED Diagnoses     ICD-10-CM   1. Bilateral impacted cerumen  H61.23       ED Discharge Orders          Ordered    carbamide peroxide  (DEBROX) 6.5 % OTIC solution  2 times daily        05/15/23 0513              Discharge Instructions Discussed with and Provided to Patient:   Discharge Instructions   None      Roxy Horseman, PA-C 05/15/23 0517    Sabas Sous, MD 05/15/23 [phone]

## 2023-05-15 NOTE — ED Triage Notes (Signed)
Left sided ear pain x 2 days

## 2023-08-11 ENCOUNTER — Encounter (HOSPITAL_COMMUNITY): Payer: Self-pay | Admitting: Emergency Medicine

## 2023-08-11 ENCOUNTER — Emergency Department (HOSPITAL_COMMUNITY)
Admission: EM | Admit: 2023-08-11 | Discharge: 2023-08-12 | Disposition: A | Discharge status: Home / Self Care | Payer: Medicaid Other | Attending: Emergency Medicine | Admitting: Emergency Medicine

## 2023-08-11 ENCOUNTER — Emergency Department (HOSPITAL_COMMUNITY): Payer: Medicaid Other

## 2023-08-11 ENCOUNTER — Other Ambulatory Visit: Payer: Self-pay

## 2023-08-11 DIAGNOSIS — J111 Influenza due to unidentified influenza virus with other respiratory manifestations: Secondary | ICD-10-CM

## 2023-08-11 DIAGNOSIS — R519 Headache, unspecified: Secondary | ICD-10-CM | POA: Diagnosis present

## 2023-08-11 DIAGNOSIS — Z20822 Contact with and (suspected) exposure to covid-19: Secondary | ICD-10-CM | POA: Insufficient documentation

## 2023-08-11 DIAGNOSIS — J101 Influenza due to other identified influenza virus with other respiratory manifestations: Secondary | ICD-10-CM | POA: Insufficient documentation

## 2023-08-11 DIAGNOSIS — R42 Dizziness and giddiness: Secondary | ICD-10-CM

## 2023-08-11 DIAGNOSIS — R7309 Other abnormal glucose: Secondary | ICD-10-CM | POA: Insufficient documentation

## 2023-08-11 HISTORY — DX: Essential (primary) hypertension: I10

## 2023-08-11 LAB — RESP PANEL BY RT-PCR (RSV, FLU A&B, COVID)  RVPGX2
Influenza A by PCR: POSITIVE — AB
Influenza B by PCR: NEGATIVE
Resp Syncytial Virus by PCR: NEGATIVE
SARS Coronavirus 2 by RT PCR: NEGATIVE

## 2023-08-11 LAB — URINALYSIS, ROUTINE W REFLEX MICROSCOPIC
Bilirubin Urine: NEGATIVE
Glucose, UA: NEGATIVE mg/dL
Hgb urine dipstick: NEGATIVE
Ketones, ur: NEGATIVE mg/dL
Leukocytes,Ua: NEGATIVE
Nitrite: NEGATIVE
Protein, ur: 30 mg/dL — AB
Specific Gravity, Urine: 1.03 (ref 1.005–1.030)
pH: 5 (ref 5.0–8.0)

## 2023-08-11 LAB — BASIC METABOLIC PANEL
Anion gap: 11 (ref 5–15)
BUN: 11 mg/dL (ref 6–20)
CO2: 22 mmol/L (ref 22–32)
Calcium: 8.8 mg/dL — ABNORMAL LOW (ref 8.9–10.3)
Chloride: 103 mmol/L (ref 98–111)
Creatinine, Ser: 1.13 mg/dL (ref 0.61–1.24)
GFR, Estimated: >60 mL/min (ref >60)
Glucose, Bld: 108 mg/dL — ABNORMAL HIGH (ref 70–99)
Potassium: 3.8 mmol/L (ref 3.5–5.1)
Sodium: 136 mmol/L (ref 135–145)

## 2023-08-11 LAB — CBC
HCT: 43.7 % (ref 39.0–52.0)
Hemoglobin: 14.3 g/dL (ref 13.0–17.0)
MCH: 28 pg (ref 26.0–34.0)
MCHC: 32.7 g/dL (ref 30.0–36.0)
MCV: 85.7 fL (ref 80.0–100.0)
Platelets: 215 10*3/uL (ref 150–400)
RBC: 5.1 MIL/uL (ref 4.22–5.81)
RDW: 15.4 % (ref 11.5–15.5)
WBC: 9 10*3/uL (ref 4.0–10.5)
nRBC: 0 % (ref 0.0–0.2)

## 2023-08-11 LAB — LIPASE, BLOOD: Lipase: 43 U/L (ref 11–51)

## 2023-08-11 LAB — CBG MONITORING, ED: Glucose-Capillary: 109 mg/dL — ABNORMAL HIGH (ref 70–99)

## 2023-08-11 MED ORDER — DIAZEPAM 5 MG/ML IJ SOLN
2.5000 mg | Freq: Once | INTRAMUSCULAR | Status: AC
  Administered 2023-08-11: 2.5 mg via INTRAVENOUS
  Filled 2023-08-11: qty 2

## 2023-08-11 MED ORDER — MECLIZINE HCL 25 MG PO TABS
25.0000 mg | ORAL_TABLET | Freq: Once | ORAL | Status: AC
  Administered 2023-08-11: 25 mg via ORAL
  Filled 2023-08-11: qty 1

## 2023-08-11 MED ORDER — SODIUM CHLORIDE 0.9 % IV BOLUS
1000.0000 mL | Freq: Once | INTRAVENOUS | Status: AC
Start: 2023-08-11 — End: 2023-08-11
  Administered 2023-08-11: 1000 mL via INTRAVENOUS

## 2023-08-11 MED ORDER — ACETAMINOPHEN 500 MG PO TABS
1000.0000 mg | ORAL_TABLET | Freq: Once | ORAL | Status: AC
  Administered 2023-08-11: 1000 mg via ORAL
  Filled 2023-08-11: qty 2

## 2023-08-11 MED ORDER — ONDANSETRON HCL 4 MG/2ML IJ SOLN
4.0000 mg | Freq: Once | INTRAMUSCULAR | Status: AC
Start: 2023-08-11 — End: 2023-08-11
  Administered 2023-08-11: 4 mg via INTRAVENOUS
  Filled 2023-08-11: qty 2

## 2023-08-11 MED ORDER — KETOROLAC TROMETHAMINE 15 MG/ML IJ SOLN
15.0000 mg | Freq: Once | INTRAMUSCULAR | Status: AC
  Administered 2023-08-11: 15 mg via INTRAVENOUS
  Filled 2023-08-11: qty 1

## 2023-08-11 NOTE — ED Triage Notes (Signed)
[facility] where [facility] told EMS he developed headache and sinus congestion. Pt states this morning noted he was feeling worse having generalized weakness. Denies recent fever or sick contacts. No focal neuro deficits at present. Pt did vomit PTA. EMS placed 18g IV left FA, 4mg  zofran given. Pt with hx of HTN has scripts but unable to afford medications at present. VSS, no distress noted.

## 2023-08-11 NOTE — Discharge Instructions (Signed)
Thank you for letting us evaluate you today.  You will have tested positive for influenza/flu.  This is likely the reason for your exacerbation of dizziness and headache.  We have provided you with antinausea, antidizzy, IV fluids for your symptoms. I am going to provide you some prednisone that should help with your dizziness. If you are still experiencing dizziness, please return. You will need to be slow with your movements. You can take OTC cough and cold medication for your symptoms.   Please return to emergency department if you experience significant worsening of symptoms, altered mentation, seizure-like activity, inability to ambulate, weakness or numbness on one side of your body.  Contact a doctor if: Your medicine does not help your vertigo. Your problems get worse or you have new symptoms. You have a fever. You feel like you may vomit (nauseous), or this feeling gets worse. You start to vomit. Your family or friends see changes in how you act. You lose feeling (have numbness) in part of your body. You feel prickling and tingling in a part of your body. Get help right away if: You are always dizzy. You faint. You get very bad headaches. You get a stiff neck. Bright light starts to bother you. You have trouble moving or talking. You feel weak in your hands, arms, or legs. You have changes in your hearing or in how you see (vision). These symptoms may be an emergency. Get help right away. Call your local emergency services (911 in the [place] Do not wait to see if the symptoms will go away. Do not drive yourself to the hospital.

## 2023-08-11 NOTE — ED Provider Triage Note (Signed)
Emergency Medicine Provider Triage Evaluation Note  Duane Coleman , a 53 y.o. male  was evaluated in triage.  Pt complains of dizziness, feeling off balance, nausea, vomiting for past couple hours. Headache behind eyes that started last night and continued waking this morning. One episode of vomiting an hour ago  Nonproductive cough over past couple days. No known sick contacts, changes in med  Review of Systems  Positive: dizziness, feeling off balance, nausea, vomiting, cough Negative: Fever, sick contacts, cp, sob  Physical Exam  Ht 5\' 8"  (1.727 m)   Wt 97.5 kg   BMI 32.69 kg/m  Gen:   Awake, no distress   Resp:  Normal effort  MSK:   Moves extremities without difficulty  Other:    Medical Decision Making  Medically screening exam initiated at 5:07 PM.  Appropriate orders placed.  Duane Coleman was informed that the remainder of the evaluation will be completed by another provider, this initial triage assessment does not replace that evaluation, and the importance of remaining in the ED until their evaluation is complete.  Labs ordered   Duane Coleman, [place] 08/11/23 1711

## 2023-08-11 NOTE — ED Provider Notes (Signed)
[place] EMERGENCY DEPARTMENT AT [facility] Provider Note   CSN: [id number] Arrival date & time: 08/11/23  1643     History {Add pertinent medical, surgical, social history, OB history to HPI:1} Chief Complaint  Patient presents with   Dizziness   Headache    Duane Coleman is a 53 y.o. male with past medical history of HTN presents to emergency department for evaluation of dizziness, headache, generalized weakness, nausea, vomiting that started last evening.  He describes headache as pressure behind his eyes that started last evening and continued into this morning.  He had 1 episode of vomiting at 1600 today. He describes dizziness as "dizzy spells" that occur intermittently that worsen with movement. He denies chest pain, shortness of breath, fevers, sick contacts.  Dizziness Associated symptoms: headaches   Associated symptoms: no chest pain, no diarrhea, no nausea, no palpitations, no shortness of breath, no vomiting and no weakness   Headache Associated symptoms: dizziness   Associated symptoms: no abdominal pain, no cough, no diarrhea, no fatigue, no fever, no nausea, no numbness, no seizures, no vomiting and no weakness      Home Medications Prior to Admission medications   Medication Sig Start Date End Date Taking? Authorizing Provider  amLODipine (NORVASC) 2.5 MG tablet Take 1 tablet (2.5 mg total) by mouth daily. 05/14/22   Paseda, Baird Kay, FNP  carbamide peroxide (DEBROX) 6.5 % OTIC solution Place 5 drops into both ears 2 (two) times daily. 05/15/23   Roxy Horseman, PA-C  HYDROcodone-acetaminophen (NORCO/VICODIN) 5-325 MG per tablet Take 2 tablets by mouth every 4 (four) hours as needed. Patient not taking: Reported on 05/14/2022 01/09/14   Glynn Octave, MD  ibuprofen (ADVIL,MOTRIN) 800 MG tablet Take 1 tablet (800 mg total) by mouth 3 (three) times daily. Patient not taking: Reported on 05/14/2022 01/09/14   Glynn Octave, MD      Allergies     Patient has no known allergies.    Review of Systems   Review of Systems  Constitutional:  Negative for chills, fatigue and fever.  Respiratory:  Negative for cough, chest tightness, shortness of breath and wheezing.   Cardiovascular:  Negative for chest pain and palpitations.  Gastrointestinal:  Negative for abdominal pain, constipation, diarrhea, nausea and vomiting.  Neurological:  Positive for dizziness and headaches. Negative for seizures, weakness, light-headedness and numbness.    Physical Exam Updated Vital Signs BP (!) 161/97   Pulse 81   Temp (!) 100.6 F (38.1 C)   Resp 19   Ht 5\' 8"  (1.727 m)   Wt 97.5 kg   SpO2 95%   BMI 32.69 kg/m  Physical Exam Vitals and nursing note reviewed.  Constitutional:      General: He is not in acute distress.    Appearance: Normal appearance.  HENT:     Head: Normocephalic and atraumatic.     Right Ear: There is impacted cerumen.     Left Ear: There is impacted cerumen.     Nose: Congestion present.     Mouth/Throat:     Mouth: Mucous membranes are moist.     Pharynx: No oropharyngeal exudate or posterior oropharyngeal erythema.  Eyes:     Conjunctiva/sclera: Conjunctivae normal.  Neck:     Comments: No meningismus  Cardiovascular:     Rate and Rhythm: Normal rate.  Pulmonary:     Effort: Pulmonary effort is normal. No respiratory distress.  Musculoskeletal:     Right lower leg: No edema.  Left lower leg: No edema.  Skin:    Coloration: Skin is not jaundiced or pale.  Neurological:     Mental Status: He is alert and oriented to person, place, and time. Mental status is at baseline.     Cranial Nerves: No cranial nerve deficit.     Sensory: No sensory deficit.     Motor: No weakness.     Coordination: Coordination normal.     Gait: Gait normal.     Deep Tendon Reflexes: Reflexes normal.     Comments: Dizzy spells occasionally occur at rest but worsen with movement    ED Results / Procedures / Treatments    Labs (all labs ordered are listed, but only abnormal results are displayed) Labs Reviewed  RESP PANEL BY RT-PCR (RSV, FLU A&B, COVID)  RVPGX2 - Abnormal; Notable for the following components:      Result Value   Influenza A by PCR POSITIVE (*)    All other components within normal limits  BASIC METABOLIC PANEL - Abnormal; Notable for the following components:   Glucose, Bld 108 (*)    Calcium 8.8 (*)    All other components within normal limits  URINALYSIS, ROUTINE W REFLEX MICROSCOPIC - Abnormal; Notable for the following components:   Protein, ur 30 (*)    Bacteria, UA RARE (*)    All other components within normal limits  CBG MONITORING, ED - Abnormal; Notable for the following components:   Glucose-Capillary 109 (*)    All other components within normal limits  CBC  LIPASE, BLOOD    EKG EKG Interpretation Date/Time:  Sunday August 11 2023 16:47:00 EST Ventricular Rate:  85 PR Interval:  162 QRS Duration:  88 QT Interval:  358 QTC Calculation: 426 R Axis:   68  Text Interpretation: Normal sinus rhythm Nonspecific T wave abnormality Abnormal ECG No previous ECGs available agree, no old comparison Confirmed by Arby Barrette [phone]) on 08/11/2023 8:35:13 PM  Radiology No results found.  Procedures Procedures  {Document cardiac monitor, telemetry assessment procedure when appropriate:1}  Medications Ordered in ED Medications  acetaminophen (TYLENOL) tablet 1,000 mg (1,000 mg Oral Given 08/11/23 2058)  sodium chloride 0.9 % bolus 1,000 mL (1,000 mLs Intravenous New Bag/Given 08/11/23 2045)  ondansetron (ZOFRAN) injection 4 mg (4 mg Intravenous Given 08/11/23 2046)  ketorolac (TORADOL) 15 MG/ML injection 15 mg (15 mg Intravenous Given 08/11/23 2047)  diazepam (VALIUM) injection 2.5 mg (2.5 mg Intravenous Given 08/11/23 2055)  meclizine (ANTIVERT) tablet 25 mg (25 mg Oral Given 08/11/23 2058)    ED Course/ Medical Decision Making/ A&P   {   Click here for ABCD2, HEART  and other calculatorsREFRESH Note before signing :1}                              Medical Decision Making Amount and/or Complexity of Data Reviewed Labs: ordered.  Risk OTC drugs. Prescription drug management.   Patient presents to the ED for concern of dizziness, headache, generalized weakness, this involves an extensive number of treatment options, and is a complaint that carries with it a high risk of complications and morbidity.  The differential diagnosis includes COVID, flu, RSV, electrolyte abnormality, migraine, ICH, hypertensive crisis   Co morbidities that complicate the patient evaluation  See HPI   Additional history obtained:  Additional history obtained from Nursing and Outside Medical Records   External records from outside source obtained and reviewed including  Triage  RN note Recent medical evaluations of medication list   Lab Tests:  I Ordered, and personally interpreted labs.  The pertinent results include:   Influenza positive CBG 109 No leukocytosis UA negative for infection   Imaging Studies ordered:  I ordered imaging studies including CT head  Pending at sign out   Cardiac Monitoring:  The patient was maintained on a cardiac monitor.  I personally viewed and interpreted the cardiac monitored which showed an underlying rhythm of:  NSR with T wave inversions in V6.  No previous EKG to compare to   Medicines ordered and prescription drug management:  I ordered medication including tylenol, zofran, and IVF  for dizziness, ha, nausea Reevaluation of the patient after these medicines showed that the patient improved I have reviewed the patients home medicines and have made adjustments as needed    Problem List / ED Course:  Influenza Likely cause of symptoms Provided Zofran and fluids for symptoms.  Patient spiked fever 100.6 F in emergency department.  Provided Tylenol for this Dizziness Seems to be more positional in nature and may  be exacerbated by concurrent influenza infection Provided meclizine and IVF Headache May be due to recent influenza and dehydration however will obtain CT head as this is a new headache with dizziness Will provide migraine cocktail   Reevaluation:  After the interventions noted above, I reevaluated the patient and found that they have :improved   Social Determinants of Health:  Has history of hypertension and has a prescription available but is unable to afford medications at current time   Dispostion:  Disposition will be determined following CT head and and patient presentation following medications.  However, I believe that following medications patient will be able to be discharged  Sign out to [facility]  Dr. Clarice Pole individually assessed patient and agrees with plan Final Clinical Impression(s) / ED Diagnoses Final diagnoses:  Dizziness  Influenza  Vertigo    Rx / DC Orders ED Discharge Orders     None

## 2023-08-11 NOTE — ED Provider Notes (Signed)
I provided a substantive portion of the care of this patient.  I personally made/approved the management plan for this patient and take responsibility for the patient management.  EKG Interpretation Date/Time:  Sunday August 11 2023 16:47:00 EST Ventricular Rate:  85 PR Interval:  162 QRS Duration:  88 QT Interval:  358 QTC Calculation: 426 R Axis:   68  Text Interpretation: Normal sinus rhythm Nonspecific T wave abnormality Abnormal ECG No previous ECGs available agree, no old comparison Confirmed by Arby Barrette [phone]) on 08/11/2023 8:35:13 PM   With onset of backache headache yesterday.  Today vertiginous symptoms with significant dizziness with movements.  Patient is awake and alert.  Symptomatic with vertiginous symptoms with position movements.  No focal neurologic deficits.  No respiratory distress.  Patient has tested influenza A positive most likely the primary driver of symptoms.  However he has significant vertigo atypical for influenza.  He is proceeding with CT head and will also treat his vertigo with Valium and meclizine.   Arby Barrette, MD 08/11/23 2043

## 2023-08-11 NOTE — ED Notes (Signed)
Pt sleeping. 

## 2023-08-11 NOTE — ED Notes (Signed)
To ct

## 2023-08-12 MED ORDER — MECLIZINE HCL 25 MG PO TABS: 25.0000 mg | ORAL_TABLET | Freq: Three times a day (TID) | ORAL | 0 refills | Status: AC | PRN

## 2023-08-12 MED ORDER — PREDNISONE 20 MG PO TABS: 40.0000 mg | ORAL_TABLET | Freq: Every day | ORAL | 0 refills | Status: AC

## 2023-08-12 MED ORDER — PREDNISONE 20 MG PO TABS
40.0000 mg | ORAL_TABLET | Freq: Once | ORAL | Status: AC
  Administered 2023-08-12: 40 mg via ORAL
  Filled 2023-08-12: qty 2

## 2023-08-12 NOTE — ED Provider Notes (Signed)
Physical Exam  BP (!) 153/95   Pulse 89   Temp 99.4 F (37.4 C)   Resp 20   Ht 5\' 8"  (1.727 m)   Wt 97.5 kg   SpO2 93%   BMI 32.69 kg/m   Physical Exam Vitals and nursing note reviewed.  Constitutional:      General: He is not in acute distress.    Appearance: He is not ill-appearing or toxic-appearing.  HENT:     Mouth/Throat:     Mouth: Mucous membranes are moist.  Eyes:     Extraocular Movements: Extraocular movements intact.     Pupils: Pupils are equal, round, and reactive to light.  Pulmonary:     Effort: Pulmonary effort is normal. No respiratory distress.  Musculoskeletal:     Cervical back: Normal range of motion. No rigidity.  Skin:    General: Skin is warm and dry.  Neurological:     Mental Status: He is alert.     Cranial Nerves: No dysarthria or facial asymmetry.     Gait: Gait normal.     Procedures  Procedures  ED Course / MDM    Medical Decision Making Amount and/or Complexity of Data Reviewed Labs: ordered. Radiology: ordered.  Risk OTC drugs. Prescription drug management.   Accepted handoff at shift change from Sabra Heck, PA-C. Please see prior provider note for more detail.   Briefly: Patient is 53 y.o. M presenting to the ER for evaluation of dizziness, headache, generalized weakness.  He reports he is having these intermittent dizzy spells that occur and are worse with movement.  Currently received vertigo treatment as well as migraine cocktail.  Patient has the flu.  CT head pending.  Neurological exam is nonfocal.  Plan is follow-up with CT imaging and reevaluate.  CT imaging shows no acute intercranial abnormality. Per radiologist's interpretation.    On multiple reevaluations, patient's been sleeping in no acute distress.  He was able to sit up and eat without issue.  I have ambulated the patient without any dizziness.  He did roll over quickly on the bed to lay back down to go to sleep and reported he started to feel some  dizziness again however shortly resolved.  He denies any ear pain.  Concerned if he has some lab arthritis due to the viral illness with the flu causing some of his vertiginous symptoms.  Will send him home on some prednisone.  He reports that he is feeling better.  We discussed return precautions of red flag symptoms.  Discussed his medications.  Discussed taking over-the-counter cough and cold medications.  Patient verbalizes understanding and agrees to the plan.  Patient is stable and being discharged home in good condition.   Results for orders placed or performed during the hospital encounter of 08/11/23  CBG monitoring, ED   Collection Time: 08/11/23  4:59 PM  Result Value Ref Range   Glucose-Capillary 109 (H) 70 - 99 mg/dL  Resp panel by RT-PCR (RSV, Flu A&B, Covid) Anterior Nasal Swab   Collection Time: 08/11/23  5:00 PM   Specimen: Anterior Nasal Swab  Result Value Ref Range   SARS Coronavirus 2 by RT PCR NEGATIVE NEGATIVE   Influenza A by PCR POSITIVE (A) NEGATIVE   Influenza B by PCR NEGATIVE NEGATIVE   Resp Syncytial Virus by PCR NEGATIVE NEGATIVE  Basic metabolic panel   Collection Time: 08/11/23  5:00 PM  Result Value Ref Range   Sodium 136 135 - 145 mmol/L   Potassium  3.8 3.5 - 5.1 mmol/L   Chloride 103 98 - 111 mmol/L   CO2 22 22 - 32 mmol/L   Glucose, Bld 108 (H) 70 - 99 mg/dL   BUN 11 6 - 20 mg/dL   Creatinine, Ser 7.84 0.61 - 1.24 mg/dL   Calcium 8.8 (L) 8.9 - 10.3 mg/dL   GFR, Estimated >69 >62 mL/min   Anion gap 11 5 - 15  CBC   Collection Time: 08/11/23  5:00 PM  Result Value Ref Range   WBC 9.0 4.0 - 10.5 K/uL   RBC 5.10 4.22 - 5.81 MIL/uL   Hemoglobin 14.3 13.0 - 17.0 g/dL   HCT 95.2 84.1 - 32.4 %   MCV 85.7 80.0 - 100.0 fL   MCH 28.0 26.0 - 34.0 pg   MCHC 32.7 30.0 - 36.0 g/dL   RDW 40.1 02.7 - 25.3 %   Platelets 215 150 - 400 K/uL   nRBC 0.0 0.0 - 0.2 %  Lipase, blood   Collection Time: 08/11/23  5:00 PM  Result Value Ref Range   Lipase 43 11 -  51 U/L  Urinalysis, Routine w reflex microscopic -Urine, Clean Catch   Collection Time: 08/11/23  5:30 PM  Result Value Ref Range   Color, Urine YELLOW YELLOW   APPearance CLEAR CLEAR   Specific Gravity, Urine 1.030 1.005 - 1.030   pH 5.0 5.0 - 8.0   Glucose, UA NEGATIVE NEGATIVE mg/dL   Hgb urine dipstick NEGATIVE NEGATIVE   Bilirubin Urine NEGATIVE NEGATIVE   Ketones, ur NEGATIVE NEGATIVE mg/dL   Protein, ur 30 (A) NEGATIVE mg/dL   Nitrite NEGATIVE NEGATIVE   Leukocytes,Ua NEGATIVE NEGATIVE   RBC / HPF 0-5 0 - 5 RBC/hpf   WBC, UA 0-5 0 - 5 WBC/hpf   Bacteria, UA RARE (A) NONE SEEN   Squamous Epithelial / HPF 0-5 0 - 5 /HPF   Mucus PRESENT    CT Head Wo Contrast Result Date: 08/11/2023 CLINICAL DATA:  Headache, dizziness EXAM: CT HEAD WITHOUT CONTRAST TECHNIQUE: Contiguous axial images were obtained from the base of the skull through the vertex without intravenous contrast. RADIATION DOSE REDUCTION: This exam was performed according to the departmental dose-optimization program which includes automated exposure control, adjustment of the mA and/or kV according to patient size and/or use of iterative reconstruction technique. COMPARISON:  None Available. FINDINGS: Brain: No acute intracranial abnormality. Specifically, no hemorrhage, hydrocephalus, mass lesion, acute infarction, or significant intracranial injury. Vascular: No hyperdense vessel or unexpected calcification. Skull: No acute calvarial abnormality. Sinuses/Orbits: No acute findings Other: None IMPRESSION: No acute intracranial abnormality. Electronically Signed   By: Charlett Nose M.D.   On: 08/11/2023 23:02        Achille Rich, PA-C 08/12/23 6644    Mesner, Barbara Cower, MD 08/13/23 [phone]

## 2023-08-13 ENCOUNTER — Telehealth: Payer: Self-pay

## 2023-08-13 NOTE — Telephone Encounter (Signed)
See note

## 2023-08-16 ENCOUNTER — Telehealth: Payer: Self-pay

## 2023-08-16 NOTE — Transitions of Care (Post Inpatient/ED Visit) (Cosign Needed)
   08/16/2023  Name: Duane Coleman MRN: [id number] DOB: Jan 02, 1971  Today's TOC FU Call Status: Today's TOC FU Call Status:: Unsuccessful Call (2nd Attempt) Unsuccessful Call (2nd Attempt) Date: 08/16/23  Attempted to reach the patient regarding the most recent Inpatient/ED visit.  Follow Up Plan: Additional outreach attempts will be made to reach the patient to complete the Transitions of Care (Post Inpatient/ED visit) call.   Signature Renelda Loma RMA

## 2023-08-19 ENCOUNTER — Telehealth: Payer: Self-pay

## 2023-08-19 NOTE — Transitions of Care (Post Inpatient/ED Visit) (Cosign Needed)
   08/19/2023  Name: Duane Coleman MRN: [id number] DOB: Nov 01, 1970  Today's TOC FU Call Status: Today's TOC FU Call Status:: Unsuccessful Call (3rd Attempt) Unsuccessful Call (3rd Attempt) Date: 08/19/23  Attempted to reach the patient regarding the most recent Inpatient/ED visit.  Follow Up Plan: No further outreach attempts will be made at this time. We have been unable to contact the patient.  Signature Renelda Loma RMA

## 2023-11-04 LAB — AMB RESULTS CONSOLE CBG: Glucose: 115

## 2023-11-04 NOTE — Progress Notes (Signed)
 Pt screened for HTN, non fasting BS, SDOH needs. Blood pressure taken times two. Educated pt on risks of HTN. S/S of cardiac event and CVA symptoms. Educational material given to pt on healthy life style and eating habits. Community resources given to him in regards to housing, food and transportation.

## 2024-01-21 NOTE — Progress Notes (Signed)
 Pt attended 11/04/2023 screening event with BP of 141/87 and blood sugar was 115. Pt noted at event that he does have a PCP. At event pt did indicate food, housing, & transportation SDOH needs. Pt also noted that he is a smoker at the event.   Per initial f/u pt was reached out via phone (01/21/2024) and was left vm. Pt was also mailed abnormal results letter per initial f/u.   **Per chart review pt does have a PCP, insurance, and is not a smoker. Pt has seen PCP 3 times since event, blood sugar and A1C addressed. Pt does not indicate any SDOH needs at this time.  **No additional pt f/u to be scheduled at this time per health equity protocol.

## 2024-04-03 NOTE — Progress Notes (Signed)
 The patient attended a screening event on 11/04/2023 where his screening results were a BP of 141/87 and a blood glucose of 115. At the event the patient noted Dr. Benjamine as his PCP, is a smoker, and did not notate any insurance. Patient did indicate food, housing, and transportation SDOH needs.   Per chart review the patient does not have a PCP or any insurance listed. There are no [facility] visible appts for the patient. Chart review also indicates the patient still has housing, transportation, food, and tobacco SDOH needs.  [facility] reached out to the patient for 60 day f/u but phone went straight to vm. CHW left vm for patient to call back. Letter and resources in regards to PCP, insurance, food, housing, transportation, and tobacco were mailed to the patient. An additional follow up will be done in according to the health equity team's protocol.

## 2024-05-22 ENCOUNTER — Encounter (HOSPITAL_COMMUNITY): Payer: Self-pay | Admitting: Emergency Medicine

## 2024-06-24 ENCOUNTER — Encounter: Payer: Self-pay | Admitting: *Deleted

## 2024-06-24 NOTE — Progress Notes (Signed)
 Pt attended 11/06/2023 screening event where his b/p was 143/87 and his blood sugar was 115. At the event, pt listed Dr. Benjamine as his PCP, did not indicate any insurance, noted he was a smoker and did identify food, housing, and transportation insecurities.  Chart review at initial, 60 day and 6 month health equity f/u notes Folashade Paseda, FNP at [facility] as his PCP since 06/13/2022 but there are no [facility]-visible healthcare encounters since the event. Last [facility] visible insurance info indicates pt may have Medicaid. Health equity team member unable to contact pt by phone - today's pt's phone message stating the call cannot be completed at this time and address listed is for the [place]. Final letter sent to [facility] along with inbasket message to Madison Silvan, CNP at [facility], offering resources for housing, food, and Medicaid and other transportation. In basket message also sent to PCP clinic practice admin in case she is able to connect with pt for renewed healthcare and possible SDOH needs.  No additional health equity team follow up scheduled or possible at this time.   06/26/24 message from [facility] CNP Madison Silvan: I checked the shelter list and he is not here. I don't believe I am familiar with this name. Jan
